# Patient Record
Sex: Female | Born: 1980 | Marital: Married | Smoking: Current every day smoker
Health system: Southern US, Community
[De-identification: ages and names within clinical notes are randomized; demographics above are authoritative.]

## PROBLEM LIST (undated history)

## (undated) DIAGNOSIS — I10 Essential (primary) hypertension: Secondary | ICD-10-CM

## (undated) HISTORY — PX: INCISION AND DRAINAGE: SHX5863

## (undated) HISTORY — PX: DILATION AND CURETTAGE OF UTERUS: SHX78

## (undated) HISTORY — PX: CHOLECYSTECTOMY: SHX55

## (undated) HISTORY — PX: TONSILLECTOMY: SUR1361

---

## 2004-12-22 ENCOUNTER — Emergency Department: Payer: Self-pay | Admitting: Emergency Medicine

## 2005-09-16 ENCOUNTER — Inpatient Hospital Stay: Payer: Self-pay | Admitting: Obstetrics & Gynecology

## 2005-10-29 ENCOUNTER — Observation Stay: Payer: Self-pay

## 2007-09-16 ENCOUNTER — Emergency Department: Payer: Self-pay | Admitting: Emergency Medicine

## 2007-12-11 ENCOUNTER — Emergency Department: Payer: Self-pay | Admitting: Emergency Medicine

## 2008-01-06 ENCOUNTER — Emergency Department: Payer: Self-pay | Admitting: Emergency Medicine

## 2009-05-10 ENCOUNTER — Emergency Department: Payer: Self-pay | Admitting: Emergency Medicine

## 2009-06-28 ENCOUNTER — Emergency Department: Payer: Self-pay | Admitting: Emergency Medicine

## 2009-12-13 ENCOUNTER — Emergency Department: Payer: Self-pay | Admitting: Emergency Medicine

## 2010-04-14 ENCOUNTER — Emergency Department: Payer: Self-pay | Admitting: Internal Medicine

## 2010-04-21 ENCOUNTER — Emergency Department: Payer: Self-pay | Admitting: Emergency Medicine

## 2010-06-23 ENCOUNTER — Emergency Department: Payer: Self-pay | Admitting: Emergency Medicine

## 2010-08-19 ENCOUNTER — Emergency Department: Payer: Self-pay | Admitting: Emergency Medicine

## 2011-09-01 ENCOUNTER — Emergency Department: Payer: Self-pay | Admitting: Emergency Medicine

## 2011-09-01 LAB — URINALYSIS, COMPLETE
Bilirubin,UR: NEGATIVE
Glucose,UR: NEGATIVE mg/dL (ref 0–75)
Ketone: NEGATIVE
Nitrite: NEGATIVE
RBC,UR: 9 /HPF (ref 0–5)
Specific Gravity: 1.014 (ref 1.003–1.030)
Squamous Epithelial: 9
WBC UR: 12 /HPF (ref 0–5)

## 2011-09-01 LAB — BASIC METABOLIC PANEL
Co2: 29 mmol/L (ref 21–32)
EGFR (African American): >60
EGFR (Non-African Amer.): >60
Osmolality: 276 (ref 275–301)

## 2011-09-01 LAB — PREGNANCY, URINE: Pregnancy Test, Urine: NEGATIVE m[IU]/mL

## 2011-12-31 ENCOUNTER — Emergency Department: Payer: Self-pay | Admitting: Emergency Medicine

## 2011-12-31 LAB — CBC
MCH: 28 pg (ref 26.0–34.0)
MCHC: 34.1 g/dL (ref 32.0–36.0)
Platelet: 269 10*3/uL (ref 150–440)
RBC: 4.32 10*6/uL (ref 3.80–5.20)
RDW: 16.4 % — ABNORMAL HIGH (ref 11.5–14.5)
WBC: 12.3 10*3/uL — ABNORMAL HIGH (ref 3.6–11.0)

## 2011-12-31 LAB — COMPREHENSIVE METABOLIC PANEL
Albumin: 3.6 g/dL (ref 3.4–5.0)
Anion Gap: 9 (ref 7–16)
Bilirubin,Total: 0.2 mg/dL (ref 0.2–1.0)
Chloride: 107 mmol/L (ref 98–107)
Co2: 26 mmol/L (ref 21–32)
EGFR (Non-African Amer.): >60
Glucose: 130 mg/dL — ABNORMAL HIGH (ref 65–99)
Osmolality: 283 (ref 275–301)
Potassium: 4 mmol/L (ref 3.5–5.1)
SGOT(AST): 23 U/L (ref 15–37)
SGPT (ALT): 21 U/L (ref 12–78)
Sodium: 142 mmol/L (ref 136–145)
Total Protein: 7.5 g/dL (ref 6.4–8.2)

## 2011-12-31 LAB — URINALYSIS, COMPLETE
Bilirubin,UR: NEGATIVE
Leukocyte Esterase: NEGATIVE
Nitrite: NEGATIVE
Ph: 6 (ref 4.5–8.0)
Protein: NEGATIVE
RBC,UR: 2 /HPF (ref 0–5)

## 2012-04-25 ENCOUNTER — Emergency Department: Payer: Self-pay | Admitting: Emergency Medicine

## 2012-04-25 LAB — URINALYSIS, COMPLETE
Ketone: NEGATIVE
Leukocyte Esterase: NEGATIVE
Nitrite: NEGATIVE
Ph: 5 (ref 4.5–8.0)
Protein: NEGATIVE
RBC,UR: 5 /HPF (ref 0–5)
Specific Gravity: 1.014 (ref 1.003–1.030)
Squamous Epithelial: 17

## 2012-04-25 LAB — BASIC METABOLIC PANEL
BUN: 10 mg/dL (ref 7–18)
Calcium, Total: 9.3 mg/dL (ref 8.5–10.1)
Chloride: 106 mmol/L (ref 98–107)
Co2: 26 mmol/L (ref 21–32)
Creatinine: 0.83 mg/dL (ref 0.60–1.30)
EGFR (African American): >60
EGFR (Non-African Amer.): >60
Glucose: 99 mg/dL (ref 65–99)
Osmolality: 277 (ref 275–301)
Potassium: 3.9 mmol/L (ref 3.5–5.1)

## 2012-04-25 LAB — CBC
HGB: 13.2 g/dL (ref 12.0–16.0)
MCH: 26.6 pg (ref 26.0–34.0)
RBC: 4.96 10*6/uL (ref 3.80–5.20)
WBC: 13.9 10*3/uL — ABNORMAL HIGH (ref 3.6–11.0)

## 2012-07-18 ENCOUNTER — Emergency Department: Payer: Self-pay | Admitting: Emergency Medicine

## 2012-07-18 LAB — CBC
HCT: 38 % (ref 35.0–47.0)
HGB: 12.5 g/dL (ref 12.0–16.0)
MCH: 26.9 pg (ref 26.0–34.0)
MCV: 82 fL (ref 80–100)
Platelet: 280 10*3/uL (ref 150–440)
RBC: 4.67 10*6/uL (ref 3.80–5.20)

## 2012-07-18 LAB — COMPREHENSIVE METABOLIC PANEL
Albumin: 3.6 g/dL (ref 3.4–5.0)
Alkaline Phosphatase: 77 U/L (ref 50–136)
Anion Gap: 3 — ABNORMAL LOW (ref 7–16)
BUN: 9 mg/dL (ref 7–18)
Bilirubin,Total: 0.2 mg/dL (ref 0.2–1.0)
Calcium, Total: 9.4 mg/dL (ref 8.5–10.1)
Chloride: 107 mmol/L (ref 98–107)
Co2: 28 mmol/L (ref 21–32)
Creatinine: 0.81 mg/dL (ref 0.60–1.30)
EGFR (African American): >60
EGFR (Non-African Amer.): >60
Glucose: 149 mg/dL — ABNORMAL HIGH (ref 65–99)
Potassium: 4.6 mmol/L (ref 3.5–5.1)
SGPT (ALT): 21 U/L (ref 12–78)
Sodium: 138 mmol/L (ref 136–145)

## 2012-07-18 LAB — URINALYSIS, COMPLETE
Bilirubin,UR: NEGATIVE
Glucose,UR: NEGATIVE mg/dL (ref 0–75)
Nitrite: NEGATIVE
Protein: NEGATIVE
RBC,UR: 1 /HPF (ref 0–5)
Specific Gravity: 1.011 (ref 1.003–1.030)
WBC UR: <1 /HPF (ref 0–5)

## 2012-07-18 LAB — LIPASE, BLOOD: Lipase: 168 U/L (ref 73–393)

## 2012-07-18 LAB — PREGNANCY, URINE: Pregnancy Test, Urine: NEGATIVE m[IU]/mL

## 2012-08-31 ENCOUNTER — Emergency Department: Payer: Self-pay | Admitting: Emergency Medicine

## 2012-08-31 LAB — URINALYSIS, COMPLETE
Bilirubin,UR: NEGATIVE
Ketone: NEGATIVE
Nitrite: NEGATIVE
Ph: 5 (ref 4.5–8.0)
WBC UR: 55 /HPF (ref 0–5)

## 2012-08-31 LAB — BASIC METABOLIC PANEL
Anion Gap: 5 — ABNORMAL LOW (ref 7–16)
Calcium, Total: 9.2 mg/dL (ref 8.5–10.1)
Chloride: 107 mmol/L (ref 98–107)
EGFR (African American): >60
EGFR (Non-African Amer.): >60
Glucose: 113 mg/dL — ABNORMAL HIGH (ref 65–99)
Osmolality: 276 (ref 275–301)
Sodium: 138 mmol/L (ref 136–145)

## 2012-08-31 LAB — CBC
HCT: 37.6 % (ref 35.0–47.0)
HGB: 12.8 g/dL (ref 12.0–16.0)
MCH: 26.8 pg (ref 26.0–34.0)
MCHC: 34.1 g/dL (ref 32.0–36.0)
MCV: 79 fL — ABNORMAL LOW (ref 80–100)
RDW: 15.8 % — ABNORMAL HIGH (ref 11.5–14.5)
WBC: 16.2 10*3/uL — ABNORMAL HIGH (ref 3.6–11.0)

## 2012-12-14 ENCOUNTER — Emergency Department: Payer: Self-pay | Admitting: Emergency Medicine

## 2012-12-14 LAB — COMPREHENSIVE METABOLIC PANEL
Anion Gap: 4 — ABNORMAL LOW (ref 7–16)
BUN: 9 mg/dL (ref 7–18)
Calcium, Total: 9.1 mg/dL (ref 8.5–10.1)
Creatinine: 0.98 mg/dL (ref 0.60–1.30)
EGFR (African American): >60
Potassium: 3.7 mmol/L (ref 3.5–5.1)
SGPT (ALT): 20 U/L (ref 12–78)
Sodium: 137 mmol/L (ref 136–145)
Total Protein: 7.6 g/dL (ref 6.4–8.2)

## 2012-12-14 LAB — URINALYSIS, COMPLETE
Bilirubin,UR: NEGATIVE
Glucose,UR: NEGATIVE mg/dL (ref 0–75)
Ketone: NEGATIVE
Leukocyte Esterase: NEGATIVE
Nitrite: NEGATIVE
Ph: 6 (ref 4.5–8.0)
Protein: 25
Specific Gravity: 1.015 (ref 1.003–1.030)
WBC UR: 2 /HPF (ref 0–5)

## 2012-12-14 LAB — CBC
MCH: 26.1 pg (ref 26.0–34.0)
MCV: 78 fL — ABNORMAL LOW (ref 80–100)

## 2013-04-25 ENCOUNTER — Emergency Department: Payer: Self-pay | Admitting: Emergency Medicine

## 2015-03-20 IMAGING — CT CT ABD-PELV W/ CM
1 of 2 series · 15 of 32 positions shown, 19 images · non-contrast
Comparison: none

REASON FOR EXAM: (1) abd pain; (2) abd pain
COMMENTS:

[Series 2: soft tissue · axial · 0.78mm/px · z∈[-400,+40]mm · 15 of 162 slices shown, 19 images]
[im 8/162  soft-tissue]
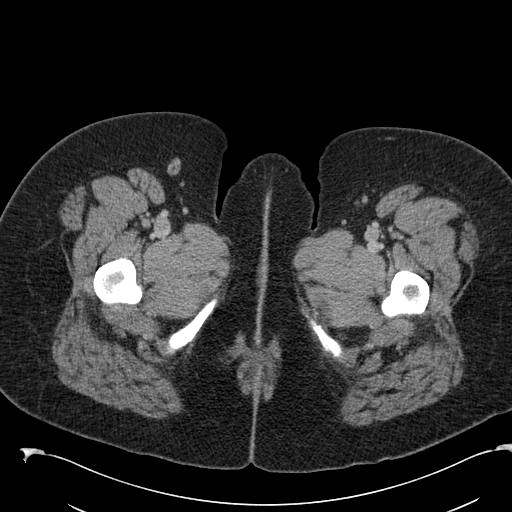
[im 8/162  bone]
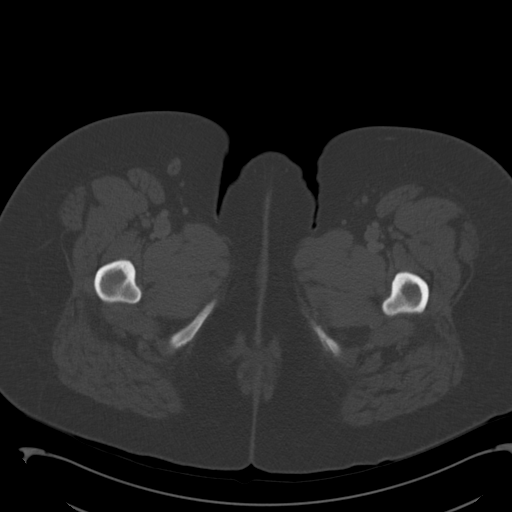
[im 22/162  soft-tissue]
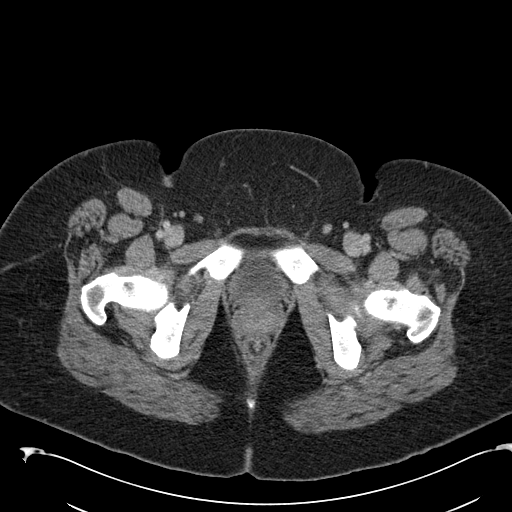
[im 36/162  soft-tissue]
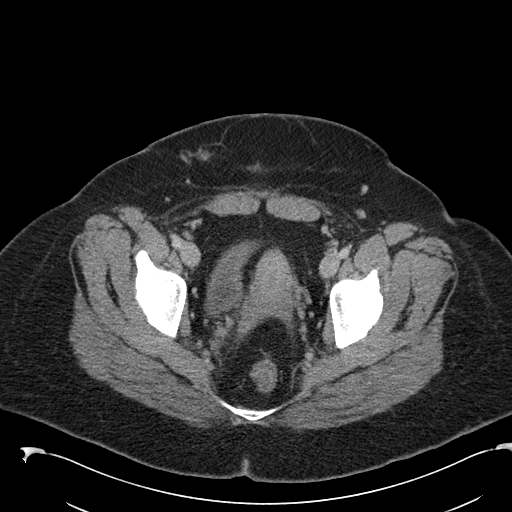
[im 43/162  soft-tissue]
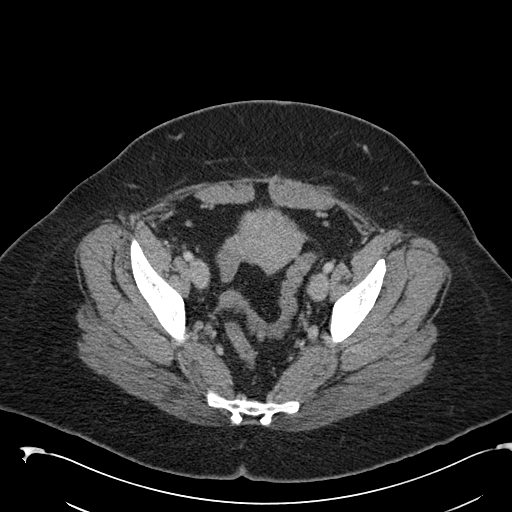
[im 57/162  soft-tissue]
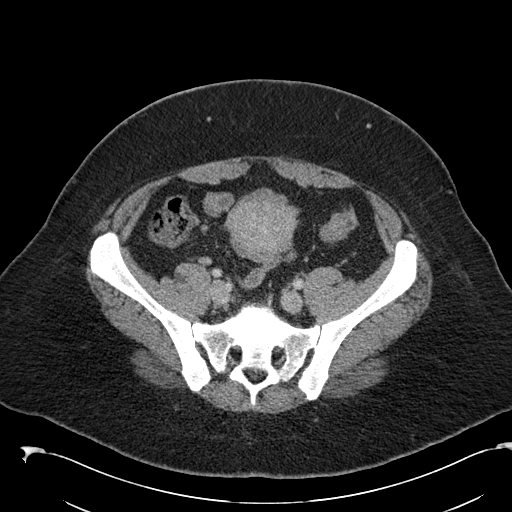
[im 71/162  soft-tissue]
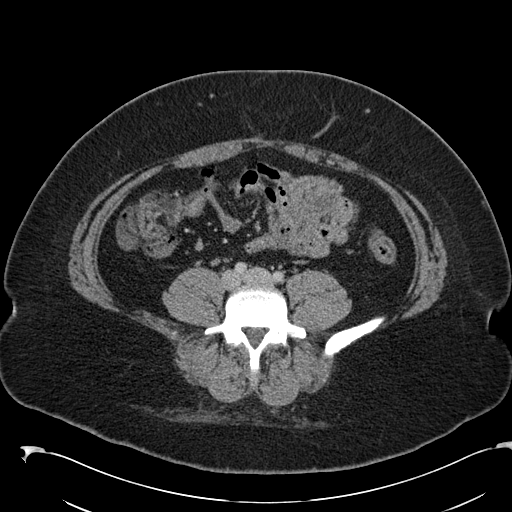
[im 85/162  soft-tissue]
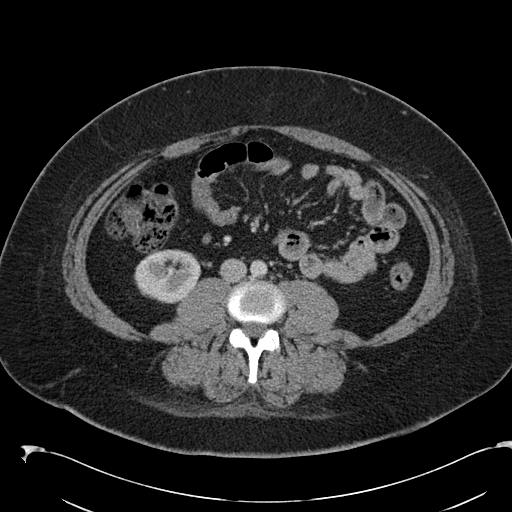
[im 92/162  soft-tissue]
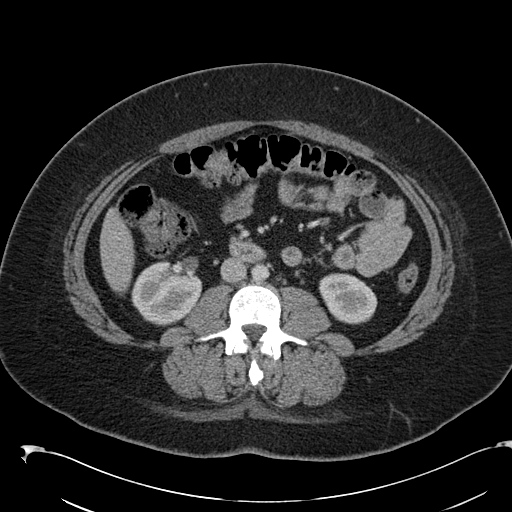
[im 106/162  soft-tissue]
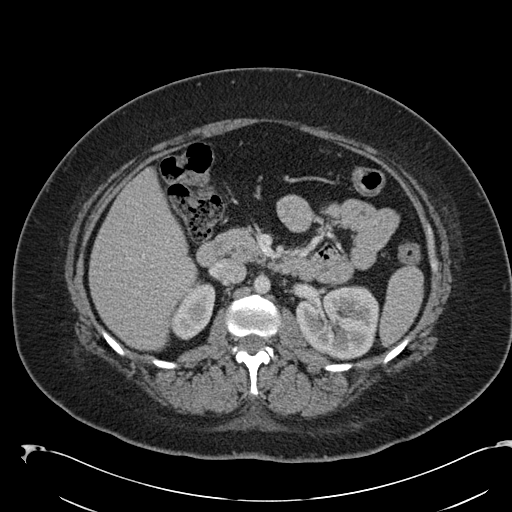
[im 106/162  bone]
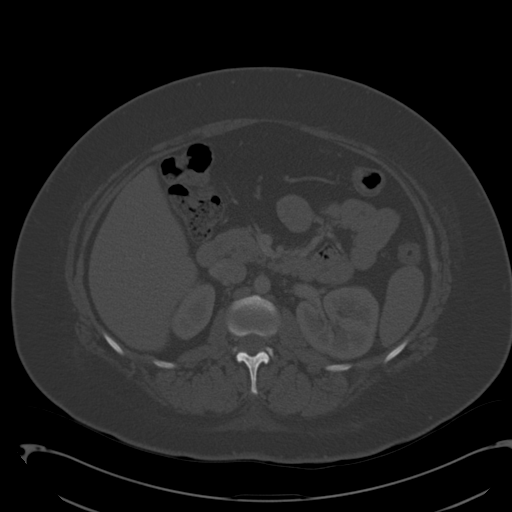
[im 120/162  soft-tissue]
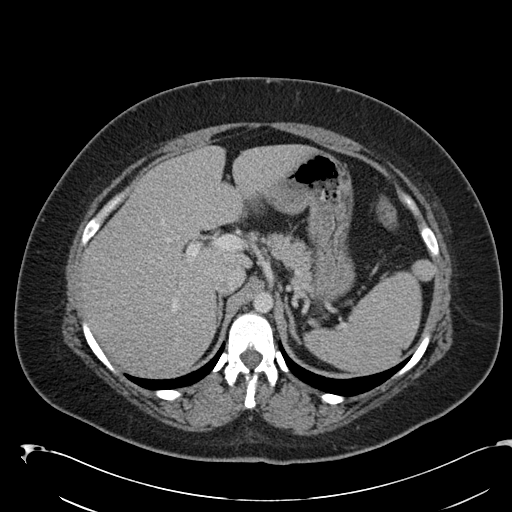
[im 127/162  soft-tissue]
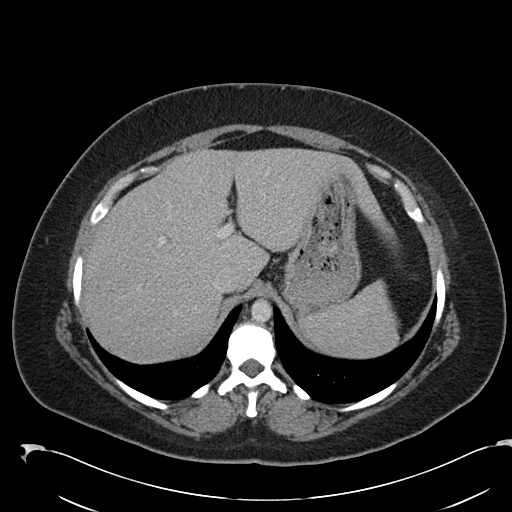
[im 134/162  lung]
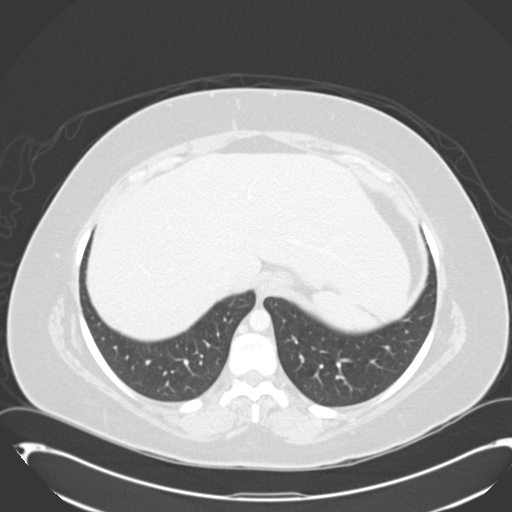
[im 141/162  soft-tissue]
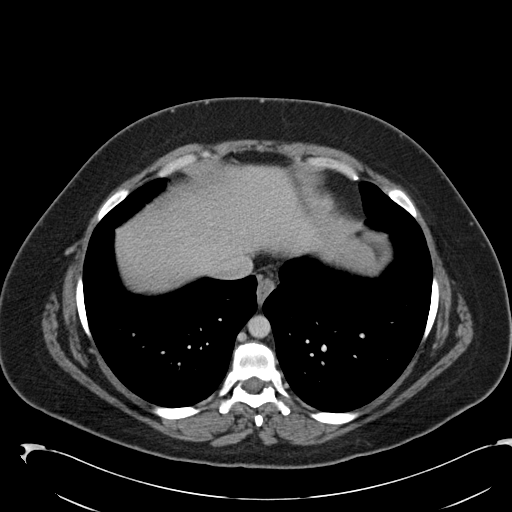
[im 141/162  lung]
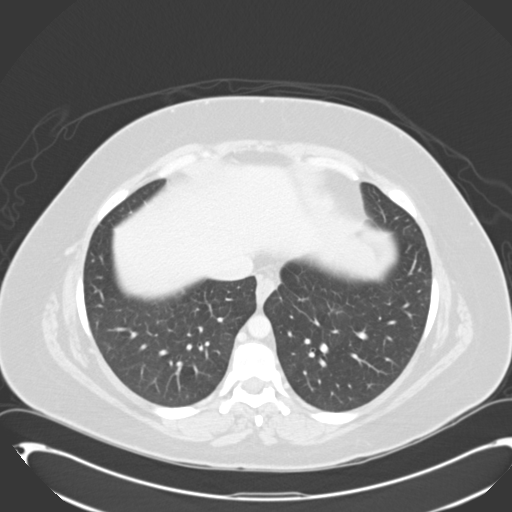
[im 148/162  lung]
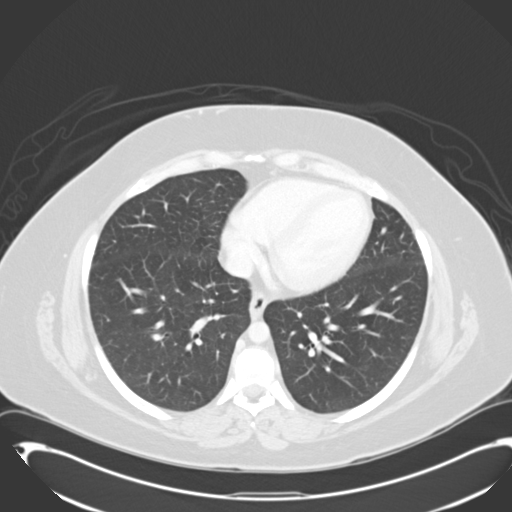
[im 155/162  soft-tissue]
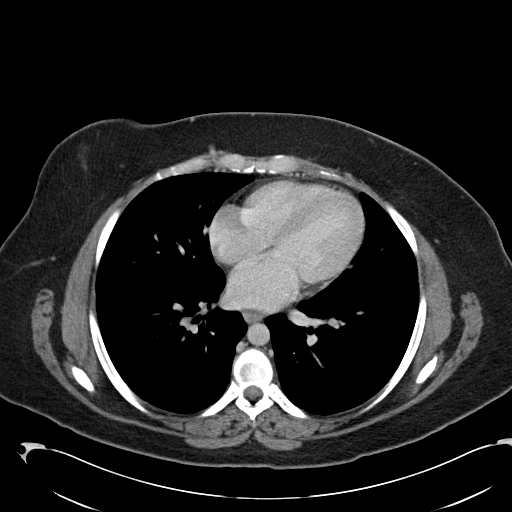
[im 155/162  lung]
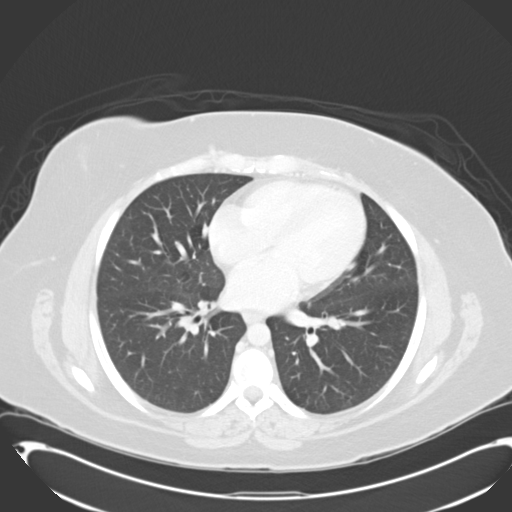

[15 of 32 positions shown; findings below may reference images not displayed]

PROCEDURE:     CT  - CT ABDOMEN / PELVIS  W  - July 18, 2012  [DATE]

RESULT:     CT of the abdomen and pelvis is performed with 100 mL of
Asovue-IJJ iodinated intravenous contrast. Oral contrast was not
administered at the request of the emergency department. Comparison is made
to previous exam dated 26 April, 2012 which did include oral contrast.
Images are reconstructed at 3 mm slice thickness.

Images through the base the lungs demonstrate and no infiltrate, mass,
effusion or pneumothorax. There is minimal stable subpleural nodularity in
the lingula appreciated on image 7 of the current study which was previously
seen on image one of the earlier study. This measures approximately 2 mm.

The heart size is unremarkable. The liver appears within normal limits for
enhancement without mass or ductal dilation. The gallbladder has been
removed with multiple cholecystectomy clips present. Polysplenia is
demonstrated. Multiple small areas of accessory splenic tissue is seen about
the inferior and anterior aspect of the spleen. The spleen is not enlarged.
The pancreas is normal in enhancement without a mass or ductal dilation. The
kidneys show no obstruction or solid or cystic mass. The abdominal aorta is
normal in caliber. The uterus is present. The urinary bladder is
unremarkable. No adnexal masses appreciated. There are multiple lymph nodes
in the right abdominal mesenteric fat. The appendix appears normal.
Correlate for mesenteric adenitis there is no ascites or abnormal fluid
collection. There is no pneumatosis, abnormal bowel distention or bowel wall
thickening. The abdominal wall is intact.
IMPRESSION: 1. No acute abdominal or pelvic abnormality evident. Possible reactive lymph
nodes in the right mesenteric fat. Correlate for mesenteric adenitis. These
lymph nodes were present previously and appear unchanged.

[REDACTED]

## 2015-12-10 ENCOUNTER — Encounter: Payer: Self-pay | Admitting: Emergency Medicine

## 2015-12-10 ENCOUNTER — Emergency Department
Admission: EM | Admit: 2015-12-10 | Discharge: 2015-12-10 | Disposition: A | Discharge status: Home / Self Care | Payer: Medicaid Other | Attending: Emergency Medicine | Admitting: Emergency Medicine

## 2015-12-10 DIAGNOSIS — F17210 Nicotine dependence, cigarettes, uncomplicated: Secondary | ICD-10-CM | POA: Insufficient documentation

## 2015-12-10 DIAGNOSIS — M549 Dorsalgia, unspecified: Secondary | ICD-10-CM | POA: Diagnosis present

## 2015-12-10 DIAGNOSIS — I10 Essential (primary) hypertension: Secondary | ICD-10-CM | POA: Insufficient documentation

## 2015-12-10 DIAGNOSIS — M62830 Muscle spasm of back: Secondary | ICD-10-CM | POA: Insufficient documentation

## 2015-12-10 HISTORY — DX: Essential (primary) hypertension: I10

## 2015-12-10 MED ORDER — PREDNISONE 10 MG PO TABS: 10.0000 mg | ORAL_TABLET | Freq: Every day | ORAL | 0 refills | Status: DC

## 2015-12-10 MED ORDER — CYCLOBENZAPRINE HCL 5 MG PO TABS: 5.0000 mg | ORAL_TABLET | Freq: Three times a day (TID) | ORAL | 0 refills | Status: DC | PRN

## 2015-12-10 NOTE — ED Triage Notes (Signed)
Low back pain x 1 1/2 weeks. Denies injury.

## 2015-12-10 NOTE — ED Provider Notes (Signed)
[facility]-EMERGENCY DEPARTMENT Provider Note   CSN: [id number] Arrival date & time: 12/10/15  1756     History   Chief Complaint Chief Complaint  Patient presents with  . Back Pain    HPI Rachel Lozano is a 35 y.o. female resents to the emergency department for evaluation of acute back pain. She denies any trauma or injury. Pain is been present for 7 days, she describes worsening tightness and spasms in her lower back that is increased with standing. She recently started a new job, she is on her feet majority of the day. She is unable to take NSAIDs. She has not been using any type of heating pad or take any muscle relaxer. She denies any abdominal pain, chest pain, shortness of breath. No urinary symptoms. No radicular symptoms also bowel or bladder since.  HPI  Past Medical History:  Diagnosis Date  . Hypertension     There are no active problems to display for this patient.   Past Surgical History:  Procedure Laterality Date  . CESAREAN SECTION    . CHOLECYSTECTOMY    . DILATION AND CURETTAGE OF UTERUS    . INCISION AND DRAINAGE    . TONSILLECTOMY      OB History    No data available       Home Medications    Prior to Admission medications   Medication Sig Start Date End Date Taking? Authorizing Provider  cyclobenzaprine (FLEXERIL) 5 MG tablet Take 1-2 tablets (5-10 mg total) by mouth 3 (three) times daily as needed for muscle spasms. 12/10/15   Evon Slack, PA-C  predniSONE (DELTASONE) 10 MG tablet Take 1 tablet (10 mg total) by mouth daily. 6,5,4,3,2,1 six day taper 12/10/15   Evon Slack, PA-C    Family History No family history on file.  Social History Social History  Substance Use Topics  . Smoking status: Current Every Day Smoker    Packs/day: 1.00    Types: Cigarettes  . Smokeless tobacco: Not on file  . Alcohol use No     Allergies   Aspirin; Erythromycin; Ibuprofen; Sulfa antibiotics; Amoxicillin; and Clindamycin/lincomycin   Review  of Systems Review of Systems  Constitutional: Negative for activity change, chills, fatigue and fever.  HENT: Negative for congestion, sinus pressure and sore throat.   Eyes: Negative for visual disturbance.  Respiratory: Negative for cough, chest tightness and shortness of breath.   Cardiovascular: Negative for chest pain and leg swelling.  Gastrointestinal: Negative for abdominal pain, diarrhea, nausea and vomiting.  Genitourinary: Negative for dysuria.  Musculoskeletal: Positive for back pain and myalgias. Negative for arthralgias and gait problem.  Skin: Negative for rash.  Neurological: Negative for weakness, numbness and headaches.  Hematological: Negative for adenopathy.  Psychiatric/Behavioral: Negative for agitation, behavioral problems and confusion.     Physical Exam Updated Vital Signs BP (!) 137/92   Pulse 85   Temp 98.1 F (36.7 C) (Oral)   Resp 18   Ht 5\' 3"  (1.6 m)   Wt 102.5 kg   LMP 12/03/2015   SpO2 97%   BMI 40.03 kg/m   Physical Exam  Constitutional: She is oriented to person, place, and time. She appears well-developed and well-nourished. No distress.  HENT:  Head: Normocephalic and atraumatic.  Mouth/Throat: Oropharynx is clear and moist.  Eyes: EOM are normal. Pupils are equal, round, and reactive to light. Right eye exhibits no discharge. Left eye exhibits no discharge.  Neck: Normal range of motion. Neck supple.  Cardiovascular:  Normal rate, regular rhythm and intact distal pulses.   Pulmonary/Chest: Effort normal and breath sounds normal. No respiratory distress. She exhibits no tenderness.  Abdominal: Soft. She exhibits no distension. There is no tenderness.  Musculoskeletal:  Lumbar Spine: Examination of the lumbar spine reveals no bony abnormality, no edema, and no ecchymosis.  There is no step off.  The patient has full range of motion of the lumbar spine with flexion and extension.  The patient has normal lateral bend and rotation.  The  patient has no pain with range of motion activities.  The patient has a negative axial load test, and a negative rotational Waddell test.  The patient is non tender along the spinous process.  The patient is  tender along the paravertebral muscles, with mild muscle spasms that are palpable.  The patient is non tender along the iliac crest.  The patient is non tender in the sciatic notch.  The patient is non tender along the Sacroiliac joint.  There is no Coccyx joint tenderness.    Bilateral Lower Extremities: Examination of the lower extremities reveals no bony abnormality, no edema, and no ecchymosis.  The patient has full active and passive range of motion of the hips, knees, and ankles.  There is no discomfort with range of motion exercises.  The patient is non tender along the greater trochanter region.  The patient has a negative Denna Haggard' test bilaterally.  There is normal skin warmth.  There is normal capillary refill bilaterally.    Neurologic: The patient has a negative straight leg raise.  The patient has normal muscle strength testing for the quadriceps, calves, ankle dorsiflexion, ankle plantarflexion, and extensor hallicus longus.  The patient has sensation that is intact to light touch.  The deep tendon reflexes are nor  Neurological: She is alert and oriented to person, place, and time. She has normal reflexes.  Skin: Skin is warm and dry.  Psychiatric: She has a normal mood and affect. Her behavior is normal. Thought content normal.     ED Treatments / Results  Labs (all labs ordered are listed, but only abnormal results are displayed) Labs Reviewed - No data to display  EKG  EKG Interpretation None       Radiology No results found.  Procedures Procedures (including critical care time)  Medications Ordered in ED Medications - No data to display   Initial Impression / Assessment and Plan / ED Course  I have reviewed the triage vital signs and the nursing  notes.  Pertinent labs & imaging results that were available during my care of the patient were reviewed by me and considered in my medical decision making (see chart for details).  Clinical Course    35 year old female with lower back pain. Nontraumatic. No numbness tingling or radicular symptoms. No neurological deficits. On exam, patient with palpable muscle spasms. She started with 6 day steroid taper due to unable to tolerate NSAIDs. She is given prescription for muscle relaxer. She'll follow-up with orthopedics. We did discuss stretching exercises. She understands signs and symptoms return to the emergency department for.  Final Clinical Impressions(s) / ED Diagnoses   Final diagnoses:  Muscle spasm of back    New Prescriptions New Prescriptions   CYCLOBENZAPRINE (FLEXERIL) 5 MG TABLET    Take 1-2 tablets (5-10 mg total) by mouth 3 (three) times daily as needed for muscle spasms.   PREDNISONE (DELTASONE) 10 MG TABLET    Take 1 tablet (10 mg total) by mouth daily. 6,5,4,3,2,1  six day taper     Evon Slackhomas C Gaines, PA-C 12/10/15 1925    Jeanmarie PlantJames A McShane, MD 12/10/15 [phone]

## 2015-12-10 NOTE — ED Notes (Signed)
Pt states low back pain with swelling in feet and ankles.

## 2017-05-01 ENCOUNTER — Encounter: Payer: Self-pay | Admitting: Emergency Medicine

## 2017-05-01 ENCOUNTER — Emergency Department
Admission: EM | Admit: 2017-05-01 | Discharge: 2017-05-01 | Discharge status: Against Medical Advice | Payer: Self-pay | Attending: Emergency Medicine | Admitting: Emergency Medicine

## 2017-05-01 DIAGNOSIS — Z5321 Procedure and treatment not carried out due to patient leaving prior to being seen by health care provider: Secondary | ICD-10-CM | POA: Insufficient documentation

## 2017-05-01 DIAGNOSIS — E049 Nontoxic goiter, unspecified: Secondary | ICD-10-CM | POA: Insufficient documentation

## 2017-05-01 NOTE — ED Triage Notes (Signed)
Pt comes into the ED from [facility] where they informed her that she has a goiter.  They informed her that she needs an US completed.  Patient has not had her thyroid medication in a year due to insurance problems.  Patient denies any other symptoms other than the lump present on her throat.  Patient has even and unlabored respirations and in NAD.

## 2017-05-01 NOTE — ED Notes (Signed)
EDP with patient to complete a screening on patient.

## 2017-05-02 ENCOUNTER — Telehealth: Payer: Self-pay | Admitting: Emergency Medicine

## 2017-05-02 NOTE — Telephone Encounter (Signed)
Called patient due to lwot to inquire about condition and follow up plans. Says she is arranging to be seen at [facility].   For both thyroid and hypertension.  She has been off her meds for >1 yr.

## 2018-08-17 ENCOUNTER — Other Ambulatory Visit: Payer: Self-pay

## 2018-08-17 ENCOUNTER — Emergency Department
Admission: EM | Admit: 2018-08-17 | Discharge: 2018-08-17 | Disposition: A | Discharge status: Home / Self Care | Payer: BLUE CROSS/BLUE SHIELD | Attending: Emergency Medicine | Admitting: Emergency Medicine

## 2018-08-17 DIAGNOSIS — Z79899 Other long term (current) drug therapy: Secondary | ICD-10-CM | POA: Diagnosis not present

## 2018-08-17 DIAGNOSIS — S0501XA Injury of conjunctiva and corneal abrasion without foreign body, right eye, initial encounter: Secondary | ICD-10-CM | POA: Diagnosis not present

## 2018-08-17 DIAGNOSIS — I10 Essential (primary) hypertension: Secondary | ICD-10-CM | POA: Diagnosis not present

## 2018-08-17 DIAGNOSIS — F17210 Nicotine dependence, cigarettes, uncomplicated: Secondary | ICD-10-CM | POA: Insufficient documentation

## 2018-08-17 DIAGNOSIS — Y939 Activity, unspecified: Secondary | ICD-10-CM | POA: Diagnosis not present

## 2018-08-17 DIAGNOSIS — Y9289 Other specified places as the place of occurrence of the external cause: Secondary | ICD-10-CM | POA: Diagnosis not present

## 2018-08-17 DIAGNOSIS — X58XXXA Exposure to other specified factors, initial encounter: Secondary | ICD-10-CM | POA: Insufficient documentation

## 2018-08-17 DIAGNOSIS — Y999 Unspecified external cause status: Secondary | ICD-10-CM | POA: Diagnosis not present

## 2018-08-17 DIAGNOSIS — H5711 Ocular pain, right eye: Secondary | ICD-10-CM | POA: Diagnosis present

## 2018-08-17 MED ORDER — CIPROFLOXACIN HCL 0.3 % OP SOLN: 1.0000 [drp] | OPHTHALMIC | 0 refills | Status: AC

## 2018-08-17 MED ORDER — TETRACAINE HCL 0.5 % OP SOLN
2.0000 [drp] | Freq: Once | OPHTHALMIC | Status: AC
  Administered 2018-08-17: 02:00:00 2 [drp] via OPHTHALMIC
  Filled 2018-08-17: qty 4

## 2018-08-17 MED ORDER — FLUORESCEIN SODIUM 1 MG OP STRP
ORAL_STRIP | OPHTHALMIC | Status: AC
  Filled 2018-08-17: qty 1

## 2018-08-17 NOTE — Discharge Instructions (Signed)
Take Tylenol for the discomfort, return to the emergency room for any new or worrisome symptoms including increased pain, swelling, change in vision, or other concerns.  Please follow close with ophthalmologist tomorrow.  Use the eyedrops as prescribed for the next 5 days.

## 2018-08-17 NOTE — ED Provider Notes (Signed)
[facility] Emergency Department Provider Note  ____________________________________________   I have reviewed the triage vital signs and the nursing notes. Where available I have reviewed prior notes and, if possible and indicated, outside hospital notes.    HISTORY  Chief Complaint Eye Pain    HPI Rachel Lozano is a 38 y.o. female patient seen and evaluated during the coronavirus epidemic during a time with low staffing states she got back from the beach yesterday, and she got some sand in her eye and now her eye hurts.  She has no change in vision no fever no chills, she thinks she may have rubbed the sand.  She does not have a sensation of foreign body.  No headache, no nausea no vomiting no other symptoms.  No trauma.  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  She has had sand in her eyes and it felt exactly like this before and she is willing to try to get an exam.      Past Medical History:  Diagnosis Date  . Hypertension     There are no active problems to display for this patient.   Past Surgical History:  Procedure Laterality Date  . CESAREAN SECTION    . CHOLECYSTECTOMY    . DILATION AND CURETTAGE OF UTERUS    . INCISION AND DRAINAGE    . TONSILLECTOMY      Prior to Admission medications   Medication Sig Start Date End Date Taking? Authorizing Provider  cyclobenzaprine (FLEXERIL) 5 MG tablet Take 1-2 tablets (5-10 mg total) by mouth 3 (three) times daily as needed for muscle spasms. 12/10/15   Evon Slack, PA-C  predniSONE (DELTASONE) 10 MG tablet Take 1 tablet (10 mg total) by mouth daily. 6,5,4,3,2,1 six day taper 12/10/15   Evon Slack, PA-C    Allergies Aspirin; Erythromycin; Ibuprofen; Sulfa antibiotics; Amoxicillin; and Clindamycin/lincomycin  No family history on file.  Social History Social History   Tobacco Use  . Smoking status: Current Every Day Smoker    Packs/day: 1.00    Types:  Cigarettes  . Smokeless tobacco: Never Used  Substance Use Topics  . Alcohol use: No  . Drug use: Not on file    Review of Systems See HPI   ____________________________________________   PHYSICAL EXAM:  VITAL SIGNS: ED Triage Vitals  Enc Vitals Group     BP 08/17/18 0143 (!) 154/94     Pulse Rate 08/17/18 0143 91     Resp 08/17/18 0143 18     Temp 08/17/18 0143 98.2 F (36.8 C)     Temp Source 08/17/18 0143 Oral     SpO2 08/17/18 0143 99 %     Weight 08/17/18 0140 218 lb (98.9 kg)     Height 08/17/18 0140 5\' 3"  (1.6 m)     Head Circumference --      Peak Flow --      Pain Score 08/17/18 0140 7     Pain Loc --      Pain Edu? --      Excl. in GC? --     Constitutional: Alert and oriented. Well appearing and in no acute distress.  She is anxious and upset at the idea of having [place] look in her eye but we have consulted to the best that we can. Eyes: Conjunctiva is injected.  Anterior chamber is quiet and deep with no cell and flare.  I have everted the lid up  and down and there is no evidence of foreign body to the extent that I can determine.  Exam is slightly limited by patient significant anxiety about the exam itself.  There is perhaps some mild superficial dye uptake on the lateral sclera under fluorescein.  Patient declines Tono-Pen because she has had it before.  Patient has no evidence of glaucoma however.  Pupils reactive.  Left eye is normal.  Exam was performed under tetracaine.  No tenderness to palpation along the side of the eye where the temporal artery is. Head: Atraumatic HEENT: No congestion/rhinnorhea. Mucous membranes are moist.  Oropharynx non-erythematous Neck:   Nontender with no meningismus, no masses, no stridor  Skin:  Skin is warm, dry and intact. No rash noted. Psychiatric: Mood and affect are excessive and tearful about the idea of an eye exam. Speech and behavior are normal.  ____________________________________________   LABS (all labs  ordered are listed, but only abnormal results are displayed)  Labs Reviewed - No data to display  Pertinent labs  results that were available during my care of the patient were reviewed by me and considered in my medical decision making (see chart for details). ____________________________________________  EKG  I personally interpreted any EKGs ordered by me or triage  ____________________________________________  RADIOLOGY  Pertinent labs & imaging results that were available during my care of the patient were reviewed by me and considered in my medical decision making (see chart for details). If possible, patient and/or family made aware of any abnormal findings.  No results found. ____________________________________________    PROCEDURES  Procedure(s) performed: None  Procedures  Critical Care performed: None  ____________________________________________   INITIAL IMPRESSION / ASSESSMENT AND PLAN / ED COURSE  Pertinent labs & imaging results that were available during my care of the patient were reviewed by me and considered in my medical decision making (see chart for details).  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  However, she did her best and we got the best views that we could.  No evidence of glaucoma although she declines Tono-Pen no evidence of iritis, there is no cell and flare, there is no foreign body noted, we will start her on antibiotics.  She is allergic to erythromycin sulfa and Clinda, so we will try Cipro eyedrops.  She does not use contacts.  Visual acuity noted.  Return questions and follow-up given and understood.    ____________________________________________   FINAL CLINICAL IMPRESSION(S) / ED DIAGNOSES  Final diagnoses:  None      This  chart was dictated using voice recognition software.  Despite best efforts to proofread,  errors can occur which can change meaning.      Jeanmarie PlantMcShane, Caedence Snowden A, MD 08/17/18 [phone]

## 2018-08-17 NOTE — ED Triage Notes (Signed)
Reports right eye pain that she noticed Saturday morning.

## 2018-08-17 NOTE — ED Notes (Signed)
Patient updated regarding wait time, verbalized understanding.

## 2019-10-30 ENCOUNTER — Telehealth: Payer: Self-pay | Admitting: Obstetrics & Gynecology

## 2019-10-30 NOTE — Telephone Encounter (Signed)
[place] family referring for Amenorrhea, Dyspareunia. Paper records Called and left voicemail for patient to call back to be scheduled.

## 2019-11-02 NOTE — Telephone Encounter (Signed)
Called and left voicemail for patient to call back to be scheduled. 

## 2019-11-04 NOTE — Telephone Encounter (Signed)
Called and left voicemail for patient to call back to be scheduled. 

## 2019-11-06 NOTE — Telephone Encounter (Signed)
Called and left voicemail for patient to call back to be scheduled. 

## 2021-06-28 ENCOUNTER — Other Ambulatory Visit: Payer: Self-pay | Admitting: Family Medicine

## 2021-06-28 DIAGNOSIS — N911 Secondary amenorrhea: Secondary | ICD-10-CM

## 2021-07-06 ENCOUNTER — Ambulatory Visit
Admission: RE | Admit: 2021-07-06 | Discharge: 2021-07-06 | Disposition: A | Discharge status: Home / Self Care | Payer: Medicaid Other | Source: Ambulatory Visit | Attending: Family Medicine | Admitting: Family Medicine

## 2021-07-06 ENCOUNTER — Other Ambulatory Visit: Payer: Self-pay

## 2021-07-06 DIAGNOSIS — N911 Secondary amenorrhea: Secondary | ICD-10-CM | POA: Insufficient documentation

## 2021-10-30 ENCOUNTER — Encounter: Payer: Self-pay | Admitting: *Deleted

## 2022-10-25 DEATH — deceased

## 2024-03-07 IMAGING — US US PELVIS COMPLETE WITH TRANSVAGINAL
1 series · 14 of 25 positions shown · non-contrast
Comparison: CT from 12/14/2012.

CLINICAL DATA: Initial evaluation for secondary amenorrhea.

EXAM:
TRANSABDOMINAL AND TRANSVAGINAL ULTRASOUND OF PELVIS
TECHNIQUE: Both transabdominal and transvaginal ultrasound examinations of the
pelvis were performed. Transabdominal technique was performed for
global imaging of the pelvis including uterus, ovaries, adnexal
regions, and pelvic cul-de-sac. It was necessary to proceed with
endovaginal exam following the transabdominal exam to visualize the
endometrium and ovaries.

[Series 1: us pelvis complete with transvaginal · 0.18mm/px · 81 acquisitions, 14 frames shown]
[im 1/81]
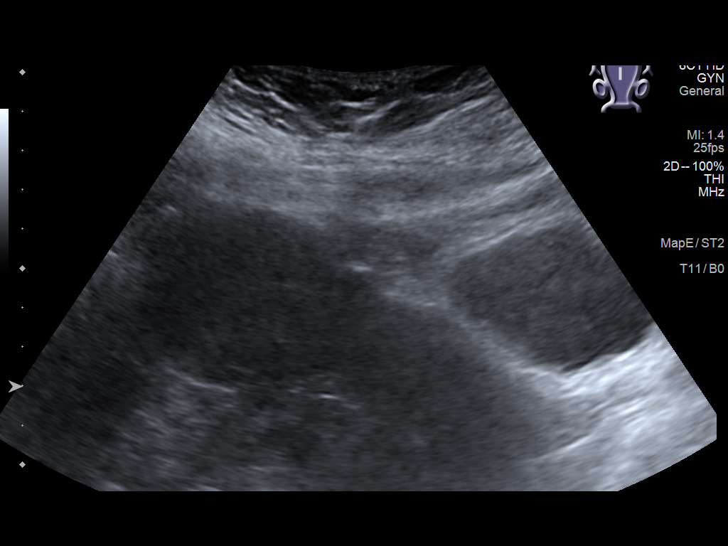
[im 7/81]
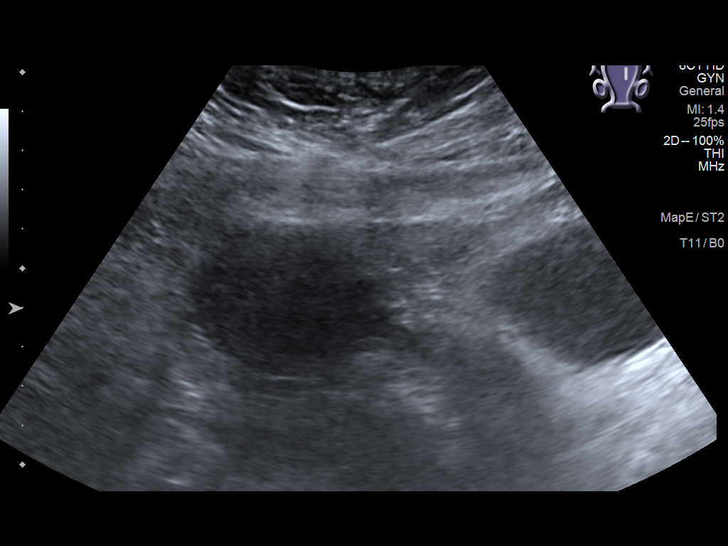
[im 14/81]
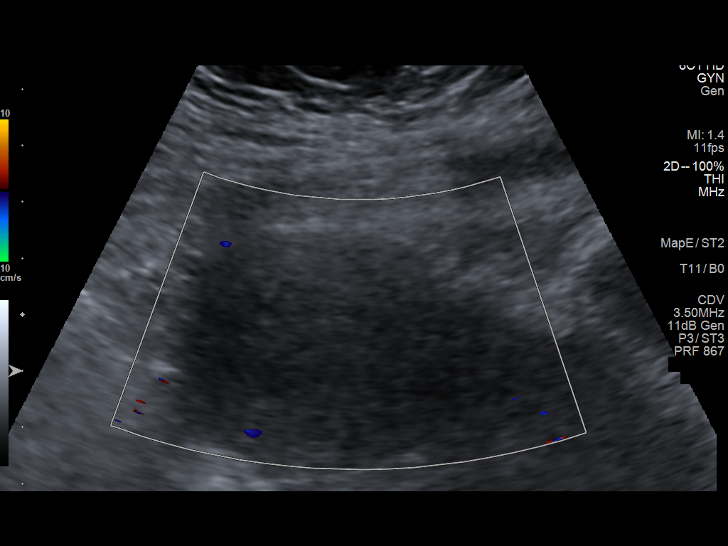
[im 21/81]
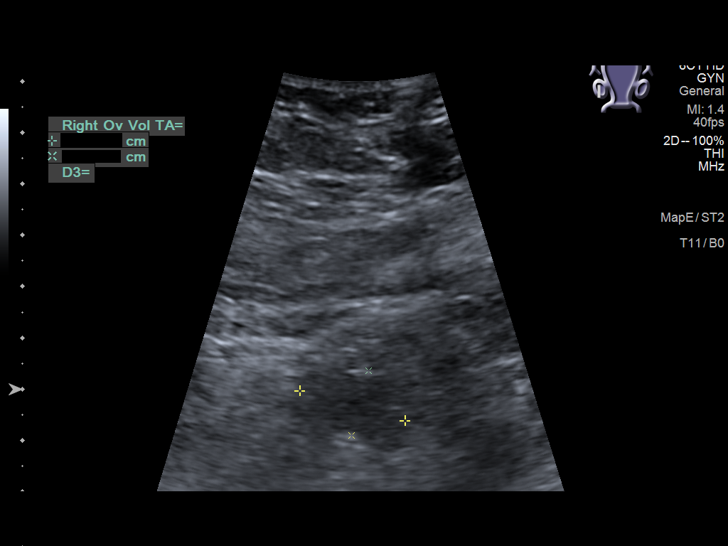
[im 27/81]
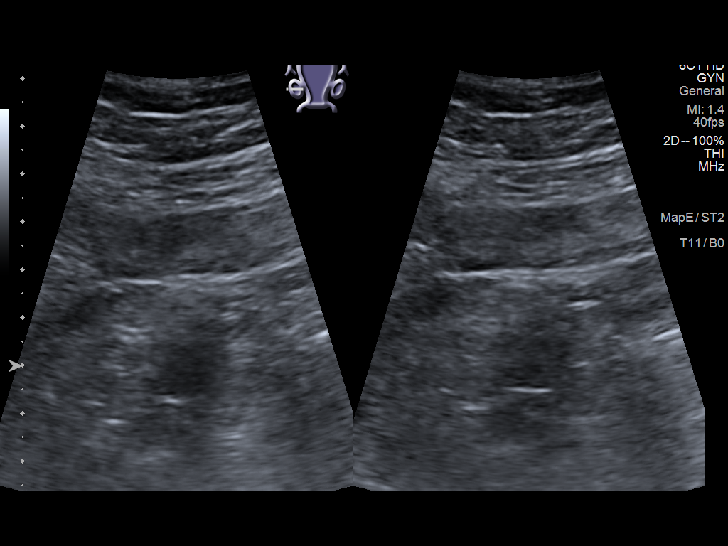
[im 31/81]
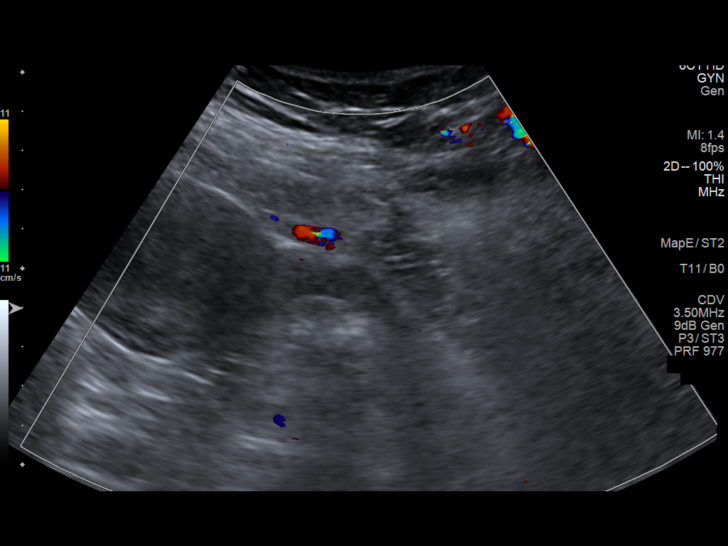
[im 37/81]
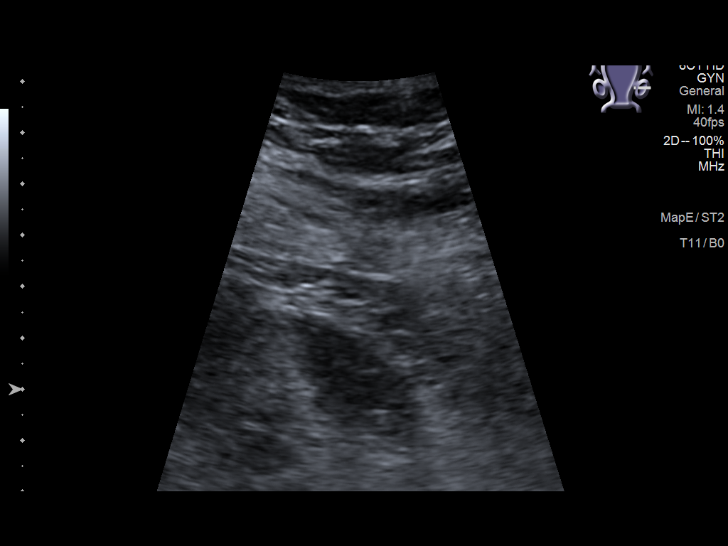
[im 44/81]
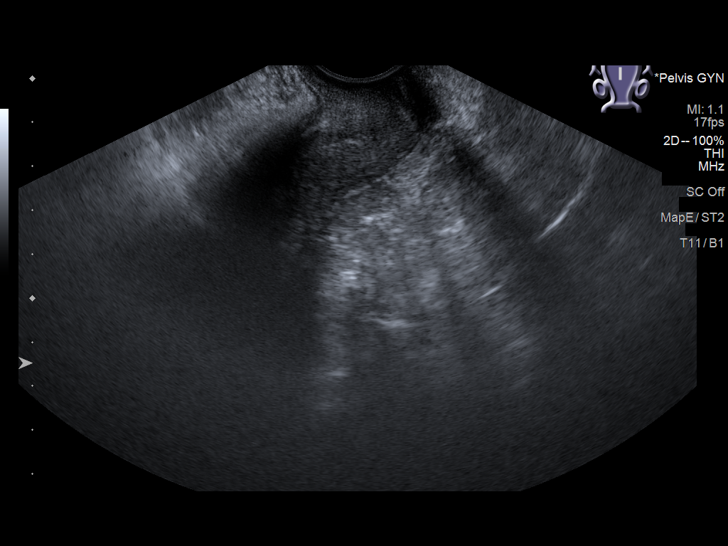
[im 51/81]
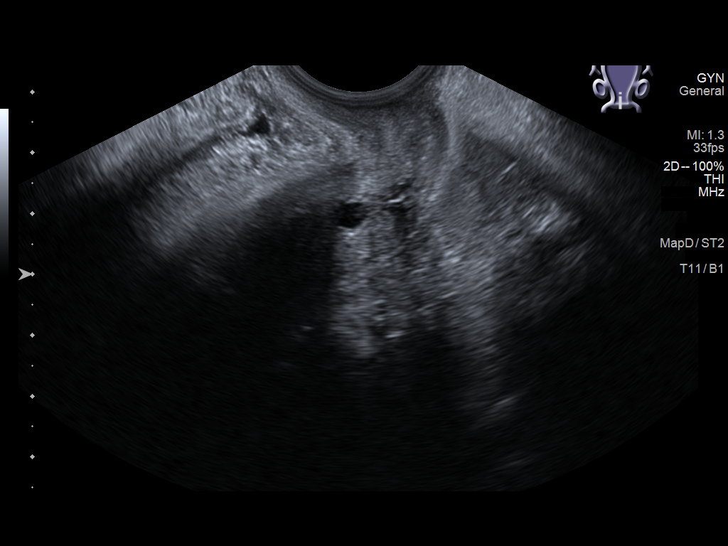
[im 54/81]
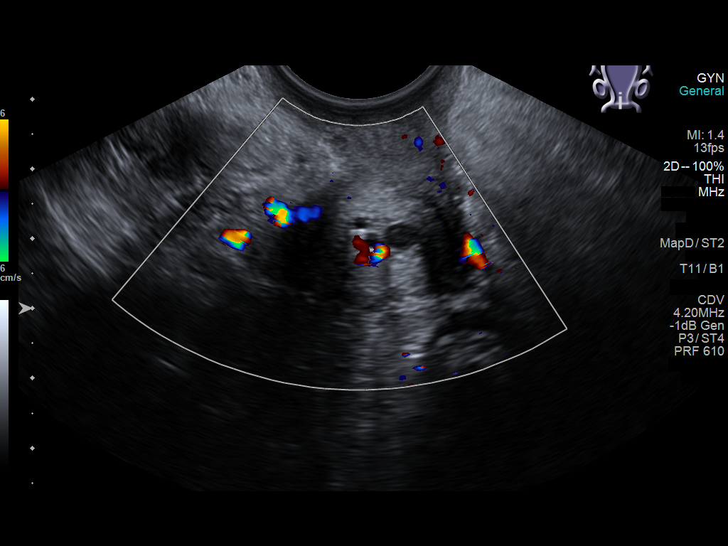
[im 61/81]
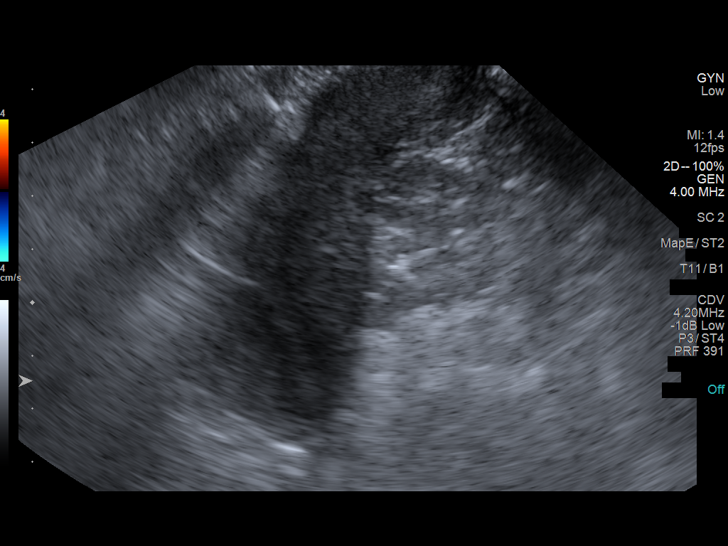
[im 67/81]
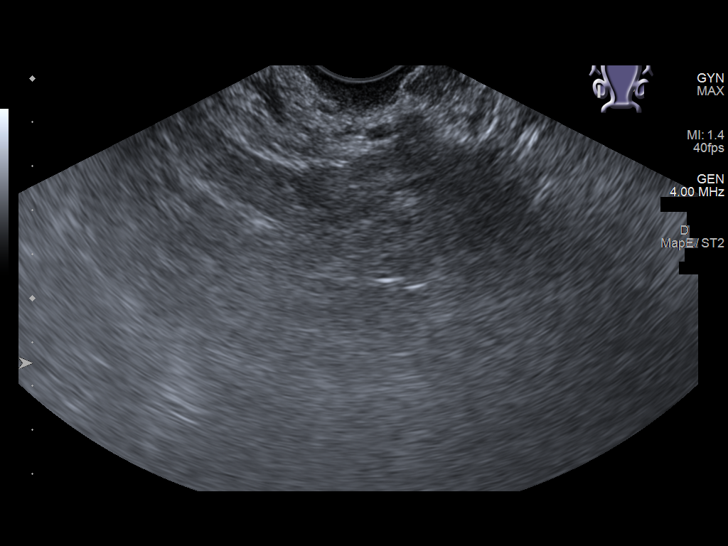
[im 74/81]
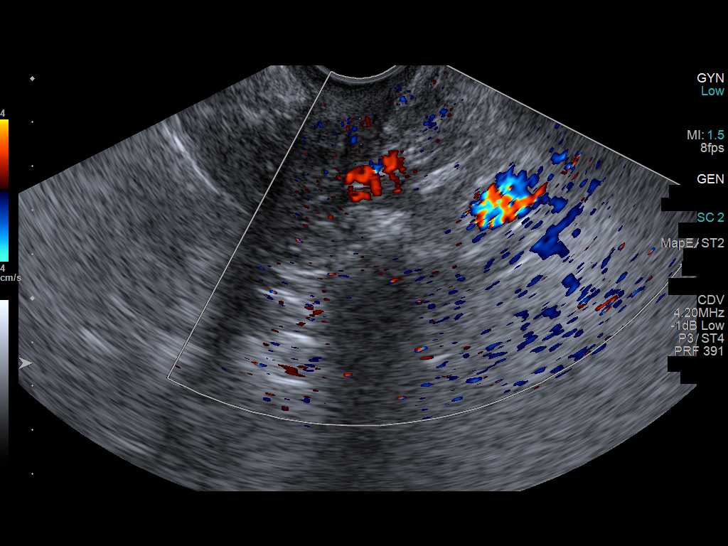
[im 81/81]
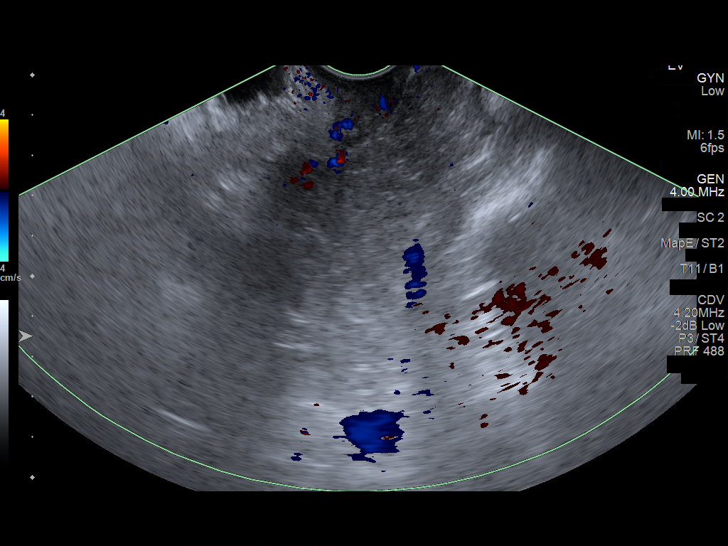

[14 of 25 positions shown; findings below may reference images not displayed]

FINDINGS: Uterus

Measurements: 8.7 x 3.1 x 3.3 cm = volume: 46.3 mL. Uterus is
retroverted. No discrete fibroid or other myometrial abnormality.

Endometrium

Thickness: 5.5 mm.  No focal abnormality visualized.

Right ovary

Measurements: 2.1 x 1.3 x 2.1 cm = volume: 3.0 mL. Normal
appearance/no adnexal mass.

Left ovary

Measurements: 2.4 x 1.3 x 2.4 cm = volume: 3.7 mL. Normal
appearance/no adnexal mass.

Other findings

No abnormal free fluid.
IMPRESSION: Normal pelvic ultrasound.
